# Patient Record
Sex: Male | Born: 1985 | Marital: Single
Health system: Southern US, Academic
[De-identification: ages and names within clinical notes are randomized; demographics above are authoritative.]

## PROBLEM LIST (undated history)

## (undated) DIAGNOSIS — K029 Dental caries, unspecified: Secondary | ICD-10-CM

## (undated) HISTORY — PX: LEG SURGERY: SHX1003

---
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
## 2010-11-06 ENCOUNTER — Emergency Department (EMERGENCY_DEPARTMENT_HOSPITAL)
Admission: EM | Admit: 2010-11-06 | Discharge: 2010-11-06 | Disposition: A | Discharge status: Home / Self Care | Payer: Self-pay

## 2010-11-06 ENCOUNTER — Emergency Department (EMERGENCY_DEPARTMENT_HOSPITAL): Payer: Self-pay

## 2018-10-16 ENCOUNTER — Other Ambulatory Visit: Payer: Self-pay

## 2018-10-16 ENCOUNTER — Ambulatory Visit (HOSPITAL_COMMUNITY)
Admission: EM | Admit: 2018-10-16 | Discharge: 2018-10-16 | Disposition: A | Discharge status: Home / Self Care | Payer: Self-pay | Attending: Family Medicine | Admitting: Family Medicine

## 2018-10-16 ENCOUNTER — Encounter (HOSPITAL_COMMUNITY): Payer: Self-pay | Admitting: Emergency Medicine

## 2018-10-16 DIAGNOSIS — R369 Urethral discharge, unspecified: Secondary | ICD-10-CM | POA: Insufficient documentation

## 2018-10-16 MED ORDER — AZITHROMYCIN 250 MG PO TABS
1000.0000 mg | ORAL_TABLET | Freq: Once | ORAL | Status: AC
  Administered 2018-10-16: 1000 mg via ORAL

## 2018-10-16 MED ORDER — CEFTRIAXONE SODIUM 250 MG IJ SOLR
INTRAMUSCULAR | Status: AC
  Filled 2018-10-16: qty 250

## 2018-10-16 MED ORDER — AZITHROMYCIN 250 MG PO TABS
ORAL_TABLET | ORAL | Status: AC
  Filled 2018-10-16: qty 4

## 2018-10-16 MED ORDER — CEFTRIAXONE SODIUM 250 MG IJ SOLR
250.0000 mg | Freq: Once | INTRAMUSCULAR | Status: AC
  Administered 2018-10-16: 250 mg via INTRAMUSCULAR

## 2018-10-16 NOTE — ED Triage Notes (Addendum)
Pt states he has been exposed to a possible STD.  He has been having penile discharge x3 days.  Pt is taking Orajel Mouthwash during triage for a filling he states came out.  He does not think it is infected, but he is in a lot of pain.

## 2018-10-16 NOTE — ED Provider Notes (Signed)
[facility]    CSN: [id number] Arrival date & time: 10/16/18  1353     History   Chief Complaint Chief Complaint  Patient presents with  . Exposure to STD    HPI Ronald Olson is a 33 y.o. male.   33 year old male comes in for 3 day history of penile discharge. Has also had dysuria without frequency, hematuria. Denies abdominal pain, nausea, vomiting. Denies fever, chills, night sweats. Denies testicular swelling/pain, penile lesion/sore. States for the past 2 months, has been sexually active with 2 male partners, no condom use.  Patient was using orajel mouthwash at triage. States filling came out. He has a dentist appointment for evaluation of these symptoms. He deferred evaluation to the mouth/teeth.     History reviewed. No pertinent past medical history.  There are no active problems to display for this patient.   History reviewed. No pertinent surgical history.     Home Medications    Prior to Admission medications   Not on File    Family History Family History  Family history unknown: Yes    Social History Social History   Tobacco Use  . Smoking status: Current Every Day Smoker    Packs/day: 1.00    Types: Cigarettes  . Smokeless tobacco: Never Used  Substance Use Topics  . Alcohol use: Never    Frequency: Never  . Drug use: Never     Allergies   Patient has no known allergies.   Review of Systems Review of Systems  Reason unable to perform ROS: See HPI as above.     Physical Exam Triage Vital Signs ED Triage Vitals  Enc Vitals Group     BP 10/16/18 1508 (!) 140/91     Pulse Rate 10/16/18 1508 97     Resp --      Temp 10/16/18 1508 98 F (36.7 C)     Temp Source 10/16/18 1508 Temporal     SpO2 10/16/18 1508 100 %     Weight --      Height --      Head Circumference --      Peak Flow --      Pain Score 10/16/18 1507 5     Pain Loc --      Pain Edu? --      Excl. in GC? --    No data found.  Updated  Vital Signs BP (!) 140/91 (BP Location: Right Arm)   Pulse 97   Temp 98 F (36.7 C) (Temporal)   SpO2 100%   Physical Exam Constitutional:      General: He is not in acute distress.    Appearance: He is well-developed. He is not diaphoretic.  HENT:     Head: Normocephalic and atraumatic.  Eyes:     Conjunctiva/sclera: Conjunctivae normal.     Pupils: Pupils are equal, round, and reactive to light.  Neurological:     Mental Status: He is alert and oriented to person, place, and time.      UC Treatments / Results  Labs (all labs ordered are listed, but only abnormal results are displayed) Labs Reviewed  URINE CYTOLOGY ANCILLARY ONLY    EKG None  Radiology No results found.  Procedures Procedures (including critical care time)  Medications Ordered in UC Medications  azithromycin (ZITHROMAX) tablet 1,000 mg (has no administration in time range)  cefTRIAXone (ROCEPHIN) injection 250 mg (has no administration in time range)    Initial Impression / Assessment  and Plan / UC Course  I have reviewed the triage vital signs and the nursing notes.  Pertinent labs & imaging results that were available during my care of the patient were reviewed by me and considered in my medical decision making (see chart for details).    Patient was treated empirically for GC. Azithromycin and Rocephin given in office today. Cytology sent, patient will be contacted with any positive results that require additional treatment. Patient to refrain from sexual activity for the next 7 days. Return precautions given.   Patient declined evaluation of dental pain. States have follow up with dentist. No further HPI/exam done.  Final Clinical Impressions(s) / UC Diagnoses   Final diagnoses:  Penile discharge    ED Prescriptions    None        Belinda FisherYu,  V, PA-C 10/16/18 1535

## 2018-10-16 NOTE — Discharge Instructions (Signed)
You were treated empirically for gonorrhea and chlamydia. Azithromycin 1g by mouth and Rocephin 250mg  injection given in office today. Cytology sent, you will be contacted with any positive results that requires further treatment. Refrain from sexual activity and alcohol use for the next 7 days. Follow up for further evaluation if having testicular pain/swelling, lesion/sore on the penis.

## 2018-10-17 LAB — URINE CYTOLOGY ANCILLARY ONLY
Chlamydia: POSITIVE — AB
Neisseria Gonorrhea: POSITIVE — AB
Trichomonas: NEGATIVE

## 2018-10-19 ENCOUNTER — Telehealth (HOSPITAL_COMMUNITY): Payer: Self-pay | Admitting: Emergency Medicine

## 2018-10-19 NOTE — Telephone Encounter (Signed)
Chlamydia is positive.  This was treated at the urgent care visit with po zithromax 1g.  Pt needs education to please refrain from sexual intercourse for 7 days to give the medicine time to work.  Sexual partners need to be notified and tested/treated.  Condoms may reduce risk of reinfection.  Recheck or followup with PCP for further evaluation if symptoms are not improving.  [facility] notified.  Test for gonorrhea was positive. This was treated at the urgent care visit with IM rocephin 250mg and po zithromax 1g. Pt needs education to refrain from sexual intercourse for 7 days after treatment to give the medicine time to work. Sexual partners need to be notified and tested/treated. Condoms may reduce risk of reinfection. Recheck or followup with PCP for further evaluation if symptoms are not improving. [facility] notified.   Attempted to reach patient. No answer at this time. Voicemail left.    

## 2018-10-23 ENCOUNTER — Telehealth (HOSPITAL_COMMUNITY): Payer: Self-pay | Admitting: Emergency Medicine

## 2018-10-23 NOTE — Telephone Encounter (Signed)
Patient contacted and made aware of all results, all questions answered. Pt states he has some "bumps on the skin down there". Pt encouraged to follow up here or with PCP for further evaluation. Pt agreeable to plan.

## 2018-10-23 NOTE — Telephone Encounter (Signed)
Attempted to reach patient x2. No answer at this time. Voicemail left.    

## 2018-12-07 ENCOUNTER — Telehealth (INDEPENDENT_AMBULATORY_CARE_PROVIDER_SITE_OTHER): Payer: Self-pay | Admitting: NURSE PRACTITIONER

## 2018-12-07 NOTE — Telephone Encounter (Signed)
Called regarding 12/14/18 appointment  Called and LMOM to call back to either confirm appointment, schedule televisit or re-schedule for June

## 2020-11-21 ENCOUNTER — Emergency Department (HOSPITAL_COMMUNITY)
Admission: EM | Admit: 2020-11-21 | Discharge: 2020-11-21 | Disposition: A | Discharge status: Home / Self Care | Payer: Self-pay | Attending: Emergency Medicine | Admitting: Emergency Medicine

## 2020-11-21 ENCOUNTER — Other Ambulatory Visit: Payer: Self-pay

## 2020-11-21 ENCOUNTER — Encounter (HOSPITAL_COMMUNITY): Payer: Self-pay | Admitting: *Deleted

## 2020-11-21 DIAGNOSIS — K0889 Other specified disorders of teeth and supporting structures: Secondary | ICD-10-CM | POA: Insufficient documentation

## 2020-11-21 DIAGNOSIS — K029 Dental caries, unspecified: Secondary | ICD-10-CM | POA: Insufficient documentation

## 2020-11-21 DIAGNOSIS — Z5321 Procedure and treatment not carried out due to patient leaving prior to being seen by health care provider: Secondary | ICD-10-CM | POA: Insufficient documentation

## 2020-11-21 HISTORY — DX: Dental caries, unspecified: K02.9

## 2020-11-21 NOTE — ED Notes (Signed)
Called pt again, no response.

## 2020-11-21 NOTE — ED Notes (Addendum)
Called pt 3x for vitals, no response.  

## 2020-11-21 NOTE — ED Triage Notes (Signed)
Pt is here for dental pain.  Pt has decaying teeth on left lower jaw.

## 2021-03-05 ENCOUNTER — Ambulatory Visit (INDEPENDENT_AMBULATORY_CARE_PROVIDER_SITE_OTHER): Payer: No Payment, Other | Admitting: Licensed Clinical Social Worker

## 2021-03-05 ENCOUNTER — Encounter (HOSPITAL_COMMUNITY): Payer: Self-pay | Admitting: Licensed Clinical Social Worker

## 2021-03-05 DIAGNOSIS — Z008 Encounter for other general examination: Secondary | ICD-10-CM

## 2021-03-05 DIAGNOSIS — Z046 Encounter for general psychiatric examination, requested by authority: Secondary | ICD-10-CM

## 2021-03-05 NOTE — Progress Notes (Signed)
Comprehensive Clinical Assessment (CCA) Note  03/05/2021 Ronald Olson [id number]  Chief Complaint:  Chief Complaint  Patient presents with   Mental Health Assessment     Need assessment for Task    Visit Diagnosis: Psych Assessment.   Virtual Visit via Video Note  I connected with Ronald Olson on 03/05/21 at  2:00 PM EDT by a video enabled telemedicine application and verified that I am speaking with the correct person using two identifiers.  Location: Patient: [facility]  Provider: [facility]    I discussed the limitations of evaluation and management by telemedicine and the availability of in person appointments. The patient expressed understanding and agreed to proceed.   Client is a 35 year old male. Client is referred by Task for a Psych assessment.   Client denies suicidal and homicidal ideations currently  Client denies hallucinations and delusions currently  Client was screened for the following SDOH: smoking, financials, and social interaction   Assessment Information that integrates subjective and objective details with a therapist's professional interpretation:     Pt was alert and oriented x 5. He was dressed casually and engaged well in therapy session. Pt presented with euthymic mood/affect. He was cooperative and maintained good eye contact.   Pt comes in today as a referral from Task. Pt was arrested for drug possession. He reports he was giving a friend a ride home. She left drug in his car and the police pulled him over. They gave him 58-month probation which ends this August. Psych Assessment is the last criteria he needs to complete his probation.   Pt reports that he has good external relationships with friends and family. He lives with his fianc currently and reports no problems currently. He states that his daughter who is 5 years old will come with them when she turns 65 so she can go to college in [place]. 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 He denied AVH, SI/HI, and substance abuse.   Client meets criteria for: Psych assessment    Client states use of the following substances: None reported     Treatment recommendations are included plan: F/u with probation officer and complete Probation by August     Client agreed with treatment recommendations.      I discussed the assessment and treatment plan with the patient. The patient was provided an opportunity to ask questions and all were answered. The patient agreed with the plan and demonstrated an understanding of the instructions.   The patient was advised to call back or seek an in-person evaluation if the symptoms worsen or if the condition fails to improve as anticipated.  I provided 45  minutes of non-face-to-face time during this encounter.   Weber Cooks, LCSW    CCA Screening, Triage and Referral (STR)  Patient Reported Information How did you hear about [place]? Other (Comment)  Referral name: TASK   Whom do you see for routine medical problems? I don't have a doctor    Have You Recently Been in Any Inpatient Treatment (Hospital/Detox/Crisis Center/28-Day Program)? No   Have You Ever Received Services From [employer] Before? No   Have You Recently Had Any Thoughts About Hurting Yourself? No  Are You Planning to Commit Suicide/Harm Yourself At This time? No   Have you Recently Had Thoughts About Hurting Someone Karolee Ohs? No   Have You Used Any Alcohol or Drugs in the Past 24 Hours? No   Do You Currently  Have a Therapist/Psychiatrist? No   Have You Been Recently Discharged From Any Office Practice or Programs? No     CCA Screening Triage Referral Assessment Type of Contact: Tele-Assessment  Is this Initial or Reassessment? Initial Assessment  Date Telepsych consult ordered in [facility]:  03/05/21  Is CPS involved or ever been involved? Never  Is APS involved or ever been involved?  Never  Patient Determined To Be At Risk for Harm To Self or Others Based on Review of Patient Reported Information or Presenting Complaint? No  Location of Assessment: [facility]  [place] of Residence: [place]   CCA Biopsychosocial Intake/Chief Complaint:  TASK: Mental Health Assessment.  Strengths: , good dad, good sig other,  Abilities: artstic  Type of Services Patient Feels are Needed: None at this time  Initial Clinical Notes/Concerns: assessment needed for TASK   Mental Health Symptoms Depression:   Difficulty Concentrating; None   Duration of Depressive symptoms: No data recorded  Mania:   None   Anxiety:    None   Psychosis:   None   Duration of Psychotic symptoms: No data recorded  Trauma:   None   Obsessions:   None   Compulsions:   None   Inattention:   None   Hyperactivity/Impulsivity:   None   Oppositional/Defiant Behaviors:   None   Emotional Irregularity:   None   Other Mood/Personality Symptoms:  No data recorded   Mental Status Exam Appearance and self-care  Stature:   Average   Weight:   Average weight   Clothing:   Casual   Grooming:   Normal   Cosmetic use:   None   Posture/gait:   Normal   Motor activity:   Not Remarkable   Sensorium  Attention:   Normal   Concentration:   Normal   Orientation:   X5   Recall/memory:   Normal   Affect and Mood  Affect:  No data recorded  Mood:  No data recorded  Relating  Eye contact:   Normal   Facial expression:  No data recorded  Attitude toward examiner:   Cooperative   Thought and Language  Speech flow:  Clear and Coherent   Thought content:   Appropriate to Mood and Circumstances   Preoccupation:  No data recorded  Hallucinations:  No data recorded  Organization:  No data recorded  [employer] of Knowledge:   Fair   Intelligence:   Average   Abstraction:   Functional   Judgement:   Fair   Education administrator:   Realistic   Insight:   Fair   Decision Making:   Normal   Social Functioning  Social Maturity:  No data recorded  Social Judgement:   Normal   Stress  Stressors:   Legal   Coping Ability:  No data recorded  Skill Deficits:  No data recorded  Supports:   Family; Friends/Service system     Religion: Religion/Spirituality Are You A Religious Person?: No  Leisure/Recreation: Leisure / Recreation Do You Have Hobbies?: Yes Leisure and Hobbies: art tatoos  Exercise/Diet: Exercise/Diet Do You Exercise?: Yes What Type of Exercise Do You Do?: Run/Walk How Many Times a Week Do You Exercise?: 6-7 times a week Have You Gained or Lost A Significant Amount of Weight in the Past Six Months?: No Do You Follow a Special Diet?: No Do You Have Any Trouble Sleeping?: No   CCA Employment/Education Employment/Work Situation: Employment / Work Situation Employment Situation: Employed Where  is Patient Currently Employed?: [employer] How Long has Patient Been Employed?: Part owner opened 9 months Are You Satisfied With Your Job?: Yes Do You Work More Than One Job?: No Patient's Job has Been Impacted by Current Illness: No Has Patient ever Been in the [employer]?: No  Education: Education Is Patient Currently Attending School?: No Last Grade Completed: 11 Did Garment/textile technologist From [employer]?: Yes (GED) Did You Attend College?: No Did You Attend Graduate School?: No Did You Have An Individualized Education Program (IIEP): No Did You Have Any Difficulty At School?: No Patient's Education Has Been Impacted by Current Illness: No   CCA Family/Childhood History Family and Relationship History: Family history Marital status: Long term relationship Long term relationship, how long?: 2.5 years Are you sexually active?: Yes What is your sexual orientation?: hetrosexual Has your sexual activity been affected by drugs, alcohol, medication, or emotional stress?: none  reported Does patient have children?: Yes How many children?: 1 How is patient's relationship with their children?: 43 years old daughter lives in [place].  Childhood History:  Childhood History By whom was/is the patient raised?: Foster parents Additional childhood history information: Grew up in foster care system. Grandmother adopted him when pt was 14 years Description of patient's relationship with caregiver when they were a child: great with grandmother Patient's description of current relationship with people who raised him/her: Passed away in 2011/12/29 Does patient have siblings?: Yes Number of Siblings: 0 Did patient suffer any verbal/emotional/physical/sexual abuse as a child?: No Did patient suffer from severe childhood neglect?: No Has patient ever been sexually abused/assaulted/raped as an adolescent or adult?: No Was the patient ever a victim of a crime or a disaster?: No Witnessed domestic violence?: No Has patient been affected by domestic violence as an adult?: No  Child/Adolescent Assessment:     CCA Substance Use Alcohol/Drug Use: Alcohol / Drug Use History of alcohol / drug use?: No history of alcohol / drug abuse    DSM5 Diagnoses: Patient Active Problem List   Diagnosis Date Noted   Psychological assessment 03/05/2021      Weber Cooks, LCSW

## 2022-01-17 ENCOUNTER — Emergency Department (HOSPITAL_COMMUNITY)
Admission: EM | Admit: 2022-01-17 | Discharge: 2022-01-17 | Disposition: A | Discharge status: Home / Self Care | Payer: Self-pay | Attending: Emergency Medicine | Admitting: Emergency Medicine

## 2022-01-17 ENCOUNTER — Emergency Department (HOSPITAL_COMMUNITY): Payer: Self-pay

## 2022-01-17 ENCOUNTER — Other Ambulatory Visit: Payer: Self-pay

## 2022-01-17 ENCOUNTER — Encounter (HOSPITAL_COMMUNITY): Payer: Self-pay | Admitting: Emergency Medicine

## 2022-01-17 DIAGNOSIS — Y990 Civilian activity done for income or pay: Secondary | ICD-10-CM | POA: Insufficient documentation

## 2022-01-17 DIAGNOSIS — S9031XA Contusion of right foot, initial encounter: Secondary | ICD-10-CM | POA: Insufficient documentation

## 2022-01-17 DIAGNOSIS — W231XXA Caught, crushed, jammed, or pinched between stationary objects, initial encounter: Secondary | ICD-10-CM | POA: Insufficient documentation

## 2022-01-17 NOTE — Discharge Instructions (Signed)
Your x-ray did not show any fractures. ? ?Please elevate your foot to Tylenol and ibuprofen as discussed below ? ?Continue to follow-up with your primary care provider as you would normally ? ? ? ? ? ?Please use Tylenol or ibuprofen for pain.  You may use 600 mg ibuprofen every 6 hours or 1000 mg of Tylenol every 6 hours.  You may choose to alternate between the 2.  This would be most effective.  Not to exceed 4 g of Tylenol within 24 hours.  Not to exceed 3200 mg ibuprofen 24 hours.  ?

## 2022-01-17 NOTE — ED Triage Notes (Signed)
Pt states he was working on his Jeep last night and the jack slipped and the jeep landed on his R foot.  C/o R foot pain. ?

## 2022-01-17 NOTE — ED Provider Notes (Signed)
?[facility] EMERGENCY DEPARTMENT ?Provider Note ? ? ?CSN: [id number] ?Arrival date & time: 01/17/22  1043 ? ?  ? ?History ? ?Chief Complaint  ?Patient presents with  ? Foot Pain  ? ? ?Benjamim Harnish is a 36 y.o. male. ? ? ?Foot Pain ?Patient is a 36 year old male presented emergency room today with pain in his right foot seems to be along the distal digit of his right toes. ? ?He states that yesterday evening a jeep that he was working on slipped off the jack and part of the car crushed his toes.  He states the full weight of the car was on his toes. ? ?He states he has had achy pain since that time.  Is been moderate but not severe.  He has taken no medications this morning and states that his pain is primarily when he pushes on his toes primarily his second third and fourth toe ? ?He states he has sensation but states that it is difficult to move his toes and he feels that his toes are swollen. ? ?  ? ?Home Medications ?Prior to Admission medications   ?Not on File  ?   ? ?Allergies    ?Patient has no known allergies.   ? ?Review of Systems   ?Review of Systems ? ?Physical Exam ?Updated Vital Signs ?BP (!) 143/88   Pulse 93   Temp 98 ?F (36.7 ?C)   Resp 20   SpO2 100%  ?Physical Exam ?Vitals and nursing note reviewed.  ?Constitutional:   ?   General: He is not in acute distress. ?   Appearance: Normal appearance. He is not ill-appearing.  ?HENT:  ?   Head: Normocephalic and atraumatic.  ?Eyes:  ?   General: No scleral icterus.    ?   Right eye: No discharge.     ?   Left eye: No discharge.  ?   Conjunctiva/sclera: Conjunctivae normal.  ?Pulmonary:  ?   Effort: Pulmonary effort is normal.  ?   Breath sounds: No stridor.  ?Musculoskeletal:  ?   Comments: No subungual hematoma.  There is tenderness at the distal joint of the second third and fourth toe on the right foot.  No tenderness proximal to this.  Able to wiggle great toe without difficulty.  Cap refill less than 2 seconds   ?Neurological:  ?   Mental Status: He is alert and oriented to person, place, and time. Mental status is at baseline.  ? ? ?ED Results / Procedures / Treatments   ?Labs ?(all labs ordered are listed, but only abnormal results are displayed) ?Labs Reviewed - No data to display ? ?EKG ?None ? ?Radiology ?DG Foot Complete Right ? ?Result Date: 01/17/2022 ?CLINICAL DATA:  Right foot pain, jack fell on it EXAM: RIGHT FOOT COMPLETE - 3+ VIEW COMPARISON:  None Available. FINDINGS: There is no evidence of fracture or dislocation. There is no evidence of arthropathy or other focal bone abnormality. Partially imaged plate and screw fixation of the distal fibula. Soft tissues are unremarkable. IMPRESSION: No fracture or dislocation of the right foot. Partially imaged plate and screw fixation of the distal fibula. Electronically Signed   By: Jearld Lesch M.D.   On: 01/17/2022 11:36   ? ?Procedures ?Procedures  ? ? ?Medications Ordered in ED ?Medications - No data to display ? ?ED Course/ Medical Decision Making/ A&P ?  ?                        ?  Medical Decision Making ?Amount and/or Complexity of Data Reviewed ?Radiology: ordered. ? ? ?Patient has a crush injury of his right foot over the second third and fourth toes on the right side.  He is distally neurovascularly intact does seem to have some discomfort with movement of his toes but is able to move them.  Does have sensation although it feels somewhat decreased.  Cap refill is intact ? ?I personally reviewed x-ray of foot I do not appreciate any fractures I agree with radiology read no acute fractures. ? ?Patient given postop shoe recommend Tylenol ibuprofen.  I offered analgesia here in the ER which she declined. ? ? ?Final Clinical Impression(s) / ED Diagnoses ?Final diagnoses:  ?Contusion of right foot, initial encounter  ? ? ?Rx / DC Orders ?ED Discharge Orders   ? ? None  ? ?  ? ? ?  ?Gailen Shelter, [place] ?01/17/22 1403 ? ?  ?Gerhard Munch, MD ?01/17/22 1523 ? ?

## 2022-02-12 ENCOUNTER — Emergency Department (HOSPITAL_COMMUNITY)
Admission: EM | Admit: 2022-02-12 | Discharge: 2022-02-13 | Disposition: A | Discharge status: Home / Self Care | Payer: Self-pay | Attending: Emergency Medicine | Admitting: Emergency Medicine

## 2022-02-12 ENCOUNTER — Other Ambulatory Visit: Payer: Self-pay

## 2022-02-12 ENCOUNTER — Encounter (HOSPITAL_COMMUNITY): Payer: Self-pay

## 2022-02-12 DIAGNOSIS — L0201 Cutaneous abscess of face: Secondary | ICD-10-CM | POA: Insufficient documentation

## 2022-02-12 LAB — URINALYSIS, ROUTINE W REFLEX MICROSCOPIC
Bilirubin Urine: NEGATIVE
Glucose, UA: NEGATIVE mg/dL
Hgb urine dipstick: NEGATIVE
Ketones, ur: NEGATIVE mg/dL
Leukocytes,Ua: NEGATIVE
Nitrite: NEGATIVE
Protein, ur: NEGATIVE mg/dL
Specific Gravity, Urine: 1.002 — ABNORMAL LOW (ref 1.005–1.030)
pH: 7 (ref 5.0–8.0)

## 2022-02-12 LAB — CBC WITH DIFFERENTIAL/PLATELET
Abs Immature Granulocytes: 0.02 10*3/uL (ref 0.00–0.07)
Basophils Absolute: 0.1 10*3/uL (ref 0.0–0.1)
Basophils Relative: 1 %
Eosinophils Absolute: 0.2 10*3/uL (ref 0.0–0.5)
Eosinophils Relative: 2 %
HCT: 46.5 % (ref 39.0–52.0)
Hemoglobin: 15.3 g/dL (ref 13.0–17.0)
Immature Granulocytes: 0 %
Lymphocytes Relative: 21 %
Lymphs Abs: 2.2 10*3/uL (ref 0.7–4.0)
MCH: 29.3 pg (ref 26.0–34.0)
MCHC: 32.9 g/dL (ref 30.0–36.0)
MCV: 89.1 fL (ref 80.0–100.0)
Monocytes Absolute: 0.7 10*3/uL (ref 0.1–1.0)
Monocytes Relative: 7 %
Neutro Abs: 7.3 10*3/uL (ref 1.7–7.7)
Neutrophils Relative %: 69 %
Platelets: 307 10*3/uL (ref 150–400)
RBC: 5.22 MIL/uL (ref 4.22–5.81)
RDW: 13.6 % (ref 11.5–15.5)
WBC: 10.4 10*3/uL (ref 4.0–10.5)
nRBC: 0 % (ref 0.0–0.2)

## 2022-02-12 LAB — COMPREHENSIVE METABOLIC PANEL
ALT: 84 U/L — ABNORMAL HIGH (ref 0–44)
AST: 54 U/L — ABNORMAL HIGH (ref 15–41)
Albumin: 4 g/dL (ref 3.5–5.0)
Alkaline Phosphatase: 82 U/L (ref 38–126)
Anion gap: 6 (ref 5–15)
BUN: 9 mg/dL (ref 6–20)
CO2: 25 mmol/L (ref 22–32)
Calcium: 9.2 mg/dL (ref 8.9–10.3)
Chloride: 107 mmol/L (ref 98–111)
Creatinine, Ser: 0.79 mg/dL (ref 0.61–1.24)
GFR, Estimated: >60 mL/min (ref >60)
Glucose, Bld: 104 mg/dL — ABNORMAL HIGH (ref 70–99)
Potassium: 3.9 mmol/L (ref 3.5–5.1)
Sodium: 138 mmol/L (ref 135–145)
Total Bilirubin: 0.8 mg/dL (ref 0.3–1.2)
Total Protein: 7.4 g/dL (ref 6.5–8.1)

## 2022-02-12 MED ORDER — AMOXICILLIN-POT CLAVULANATE 875-125 MG PO TABS: 1.0000 | ORAL_TABLET | Freq: Two times a day (BID) | ORAL | 0 refills | Status: AC

## 2022-02-12 MED ORDER — AMOXICILLIN-POT CLAVULANATE 875-125 MG PO TABS
1.0000 | ORAL_TABLET | Freq: Once | ORAL | Status: AC
  Administered 2022-02-13: 1 via ORAL
  Filled 2022-02-12: qty 1

## 2022-02-12 MED ORDER — HYDROCODONE-ACETAMINOPHEN 5-325 MG PO TABS
1.0000 | ORAL_TABLET | Freq: Once | ORAL | Status: AC
  Administered 2022-02-13: 1 via ORAL
  Filled 2022-02-12: qty 1

## 2022-02-12 NOTE — ED Triage Notes (Signed)
Pt states he woke up with an abscess on the right cheek. Pt's right cheek has 3+ swelling. Pt believes a spider bit him. Pt states he has black windows and brown recluse spiders all over his house. Pt has a bump the approx the size of a nickle on his right cheek and it's erythematous. Pt states he does have difficulty swallowing. Pt able to maintain secretions.

## 2022-02-12 NOTE — ED Provider Notes (Signed)
[facility] Provider Note  CSN: [id number] Arrival date & time: 02/12/22 1749  Chief Complaint(s) Abscess  HPI Ronald Olson is a 36 y.o. male     Abscess Location:  Face Facial abscess location:  R cheek Size:  2cm Abscess quality: induration, painful, redness and warmth   Duration:  1 day Progression:  Worsening Pain details:    Quality:  Throbbing   Severity:  Moderate   Timing:  Constant Chronicity:  New Relieved by:  Nothing Worsened by:  Draining/squeezing Associated symptoms: no fever   Risk factors: no family hx of MRSA, no hx of MRSA and no prior abscess     Past Medical History Past Medical History:  Diagnosis Date   Dental caries    Patient Active Problem List   Diagnosis Date Noted   Psychological assessment 03/05/2021   Home Medication(s) Prior to Admission medications   Medication Sig Start Date End Date Taking? Authorizing Provider  amoxicillin-clavulanate (AUGMENTIN) 875-125 MG tablet Take 1 tablet by mouth every 12 (twelve) hours. 02/12/22  Yes Leonardo Plaia, Grayce Sessions, MD                                                                                                                                    Allergies Patient has no known allergies.  Review of Systems Review of Systems  Constitutional:  Negative for fever.   As noted in HPI  Physical Exam Vital Signs  I have reviewed the triage vital signs BP 136/78   Pulse 88   Temp 98.3 F (36.8 C) (Oral)   Resp 15   Ht 6' (1.829 m)   Wt 79.4 kg   SpO2 99%   BMI 23.74 kg/m   Physical Exam Vitals reviewed.  Constitutional:      General: He is not in acute distress.    Appearance: He is well-developed. He is not diaphoretic.  HENT:     Head: Normocephalic and atraumatic.      Right Ear: External ear normal.     Left Ear: External ear normal.     Nose: Nose normal.     Mouth/Throat:     Mouth: Mucous membranes are moist.  Eyes:      General: No scleral icterus.    Conjunctiva/sclera: Conjunctivae normal.  Neck:     Trachea: Phonation normal.  Cardiovascular:     Rate and Rhythm: Normal rate and regular rhythm.  Pulmonary:     Effort: Pulmonary effort is normal. No respiratory distress.     Breath sounds: No stridor.  Abdominal:     General: There is no distension.  Musculoskeletal:        General: Normal range of motion.     Cervical back: Normal range of motion.  Skin:    Comments: Tattoos throughout the body  Neurological:     Mental Status: He is alert and oriented to person, place, and  time.  Psychiatric:        Behavior: Behavior normal.     ED Results and Treatments Labs (all labs ordered are listed, but only abnormal results are displayed) Labs Reviewed  COMPREHENSIVE METABOLIC PANEL - Abnormal; Notable for the following components:      Result Value   Glucose, Bld 104 (*)    AST 54 (*)    ALT 84 (*)    All other components within normal limits  URINALYSIS, ROUTINE W REFLEX MICROSCOPIC - Abnormal; Notable for the following components:   APPearance HAZY (*)    Specific Gravity, Urine 1.002 (*)    All other components within normal limits  CBC WITH DIFFERENTIAL/PLATELET  LACTIC ACID, PLASMA  LACTIC ACID, PLASMA                                                                                                                         EKG  EKG Interpretation  Date/Time:    Ventricular Rate:    PR Interval:    QRS Duration:   QT Interval:    QTC Calculation:   R Axis:     Text Interpretation:         Radiology No results found.  Pertinent labs & imaging results that were available during my care of the patient were reviewed by me and considered in my medical decision making (see MDM for details).  Medications Ordered in ED Medications  HYDROcodone-acetaminophen (NORCO/VICODIN) 5-325 MG per tablet 1 tablet (has no administration in time range)  amoxicillin-clavulanate (AUGMENTIN)  875-125 MG per tablet 1 tablet (has no administration in time range)                                                                                                                                     Procedures Procedures  (including critical care time)  Medical Decision Making / ED Course    Complexity of Problem:   Patient's presenting problem/concern, DDX, and MDM listed below: Right cheek abscess More consistent with an ingrown hair.   Complexity of Data:    Laboratory Tests ordered listed below with my independent interpretation: CBC without leukocytosis or anemia No significant electrolyte derangements or renal sufficiency       ED Course:    Assessment, Add'l Intervention, and Reassessment: Right cheek abscess secondary to ingrown hair. Will treat with Abx.    Final Clinical Impression(s) /  ED Diagnoses Final diagnoses:  Abscess of face   The patient appears reasonably screened and/or stabilized for discharge and I doubt any other medical condition or other [facility] requiring further screening, evaluation, or treatment in the ED at this time prior to discharge. Safe for discharge with strict return precautions.  Disposition: Discharge  Condition: Good  I have discussed the results, Dx and Tx plan with the patient/family who expressed understanding and agree(s) with the plan. Discharge instructions discussed at length. The patient/family was given strict return precautions who verbalized understanding of the instructions. No further questions at time of discharge.    ED Discharge Orders          Ordered    amoxicillin-clavulanate (AUGMENTIN) 875-125 MG tablet  Every 12 hours        02/12/22 2346            Follow Up: Primary care provider  Call  to schedule an appointment for close follow up           This chart was dictated using voice recognition software.  Despite best efforts to proofread,  errors can occur which can change the  documentation meaning.    Fatima Blank, MD 02/12/22 [phone]

## 2023-06-18 IMAGING — DX DG FOOT COMPLETE 3+V*R*
3 series · 3 of 3 positions shown · non-contrast
Comparison: None Available.

CLINICAL DATA: Right foot pain, [place] fell on it

EXAM:
RIGHT FOOT COMPLETE - 3+ VIEW

[foot ap]
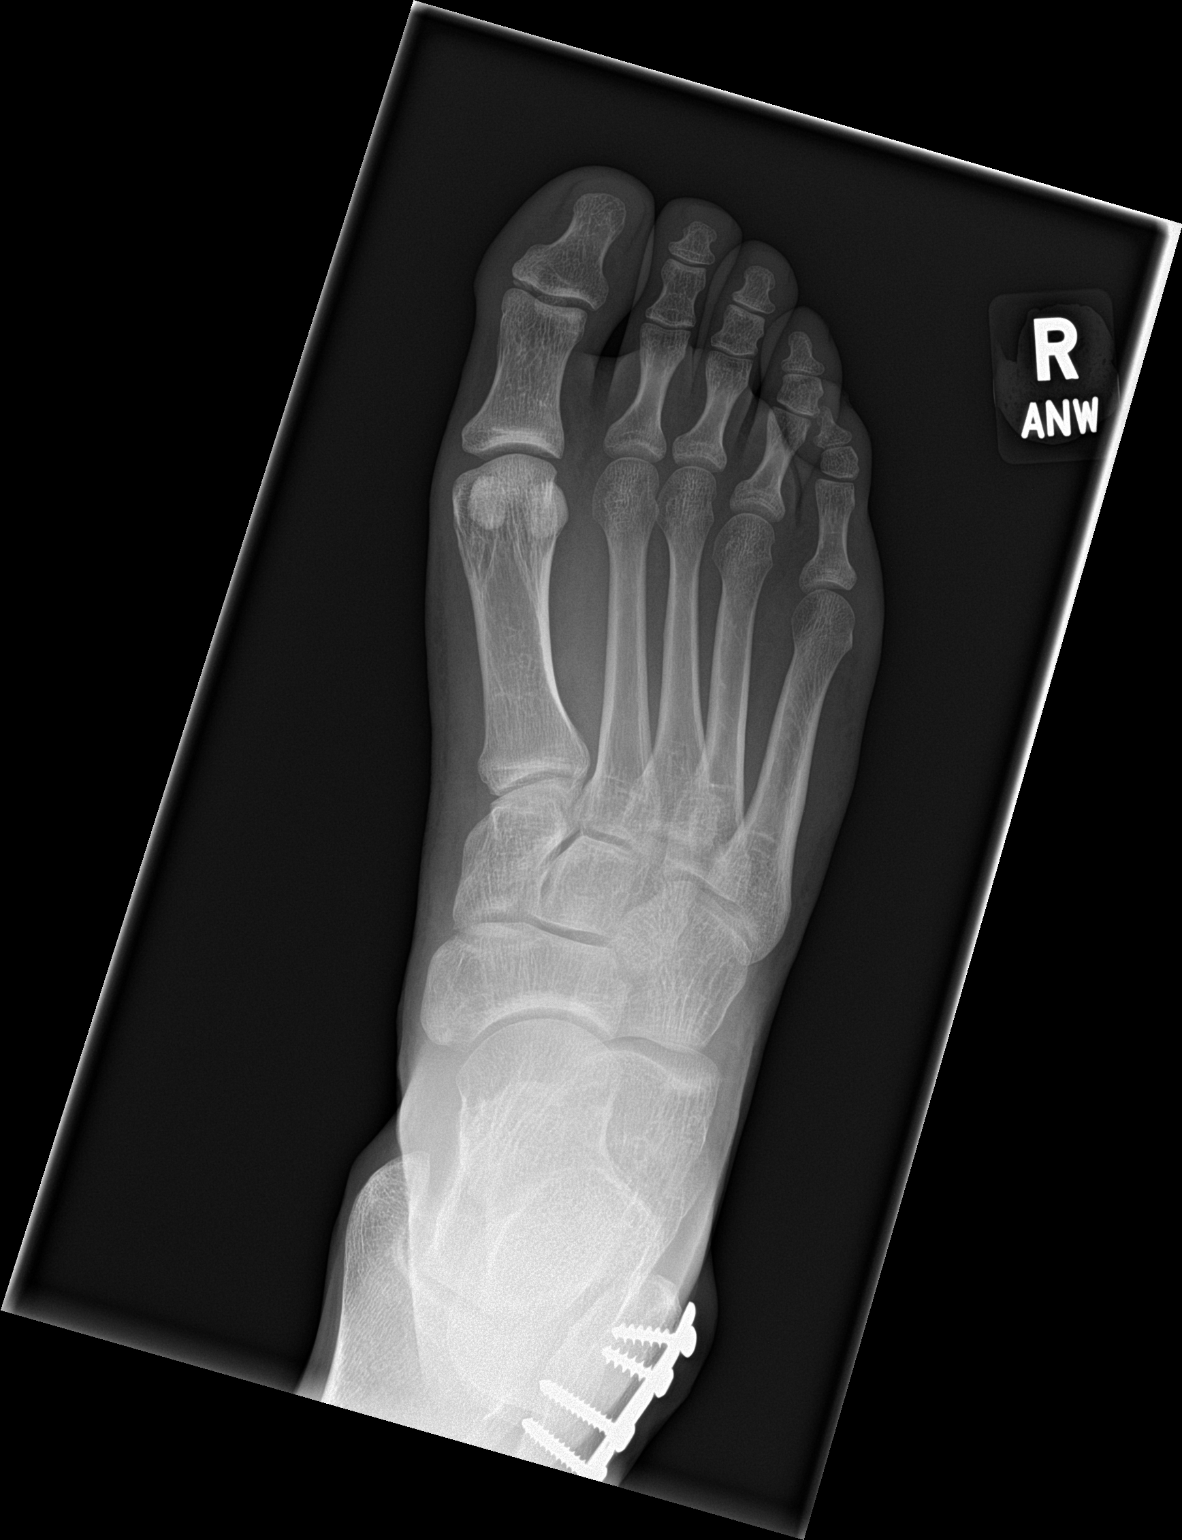

[foot obl]
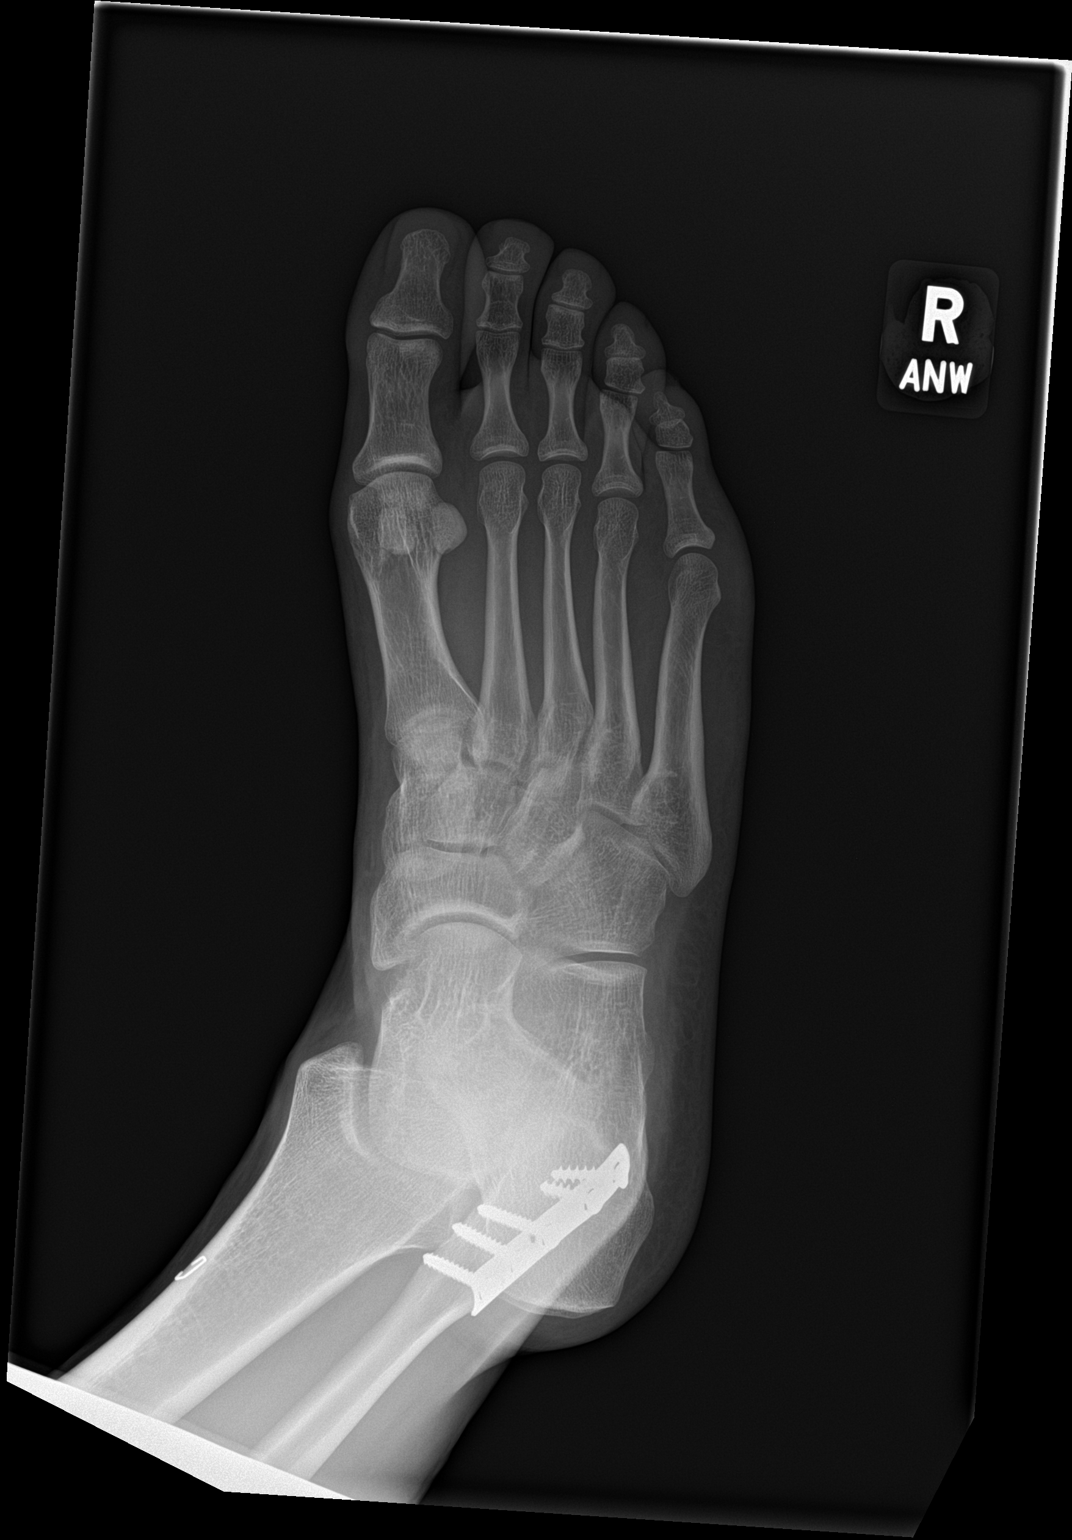

[foot lat]
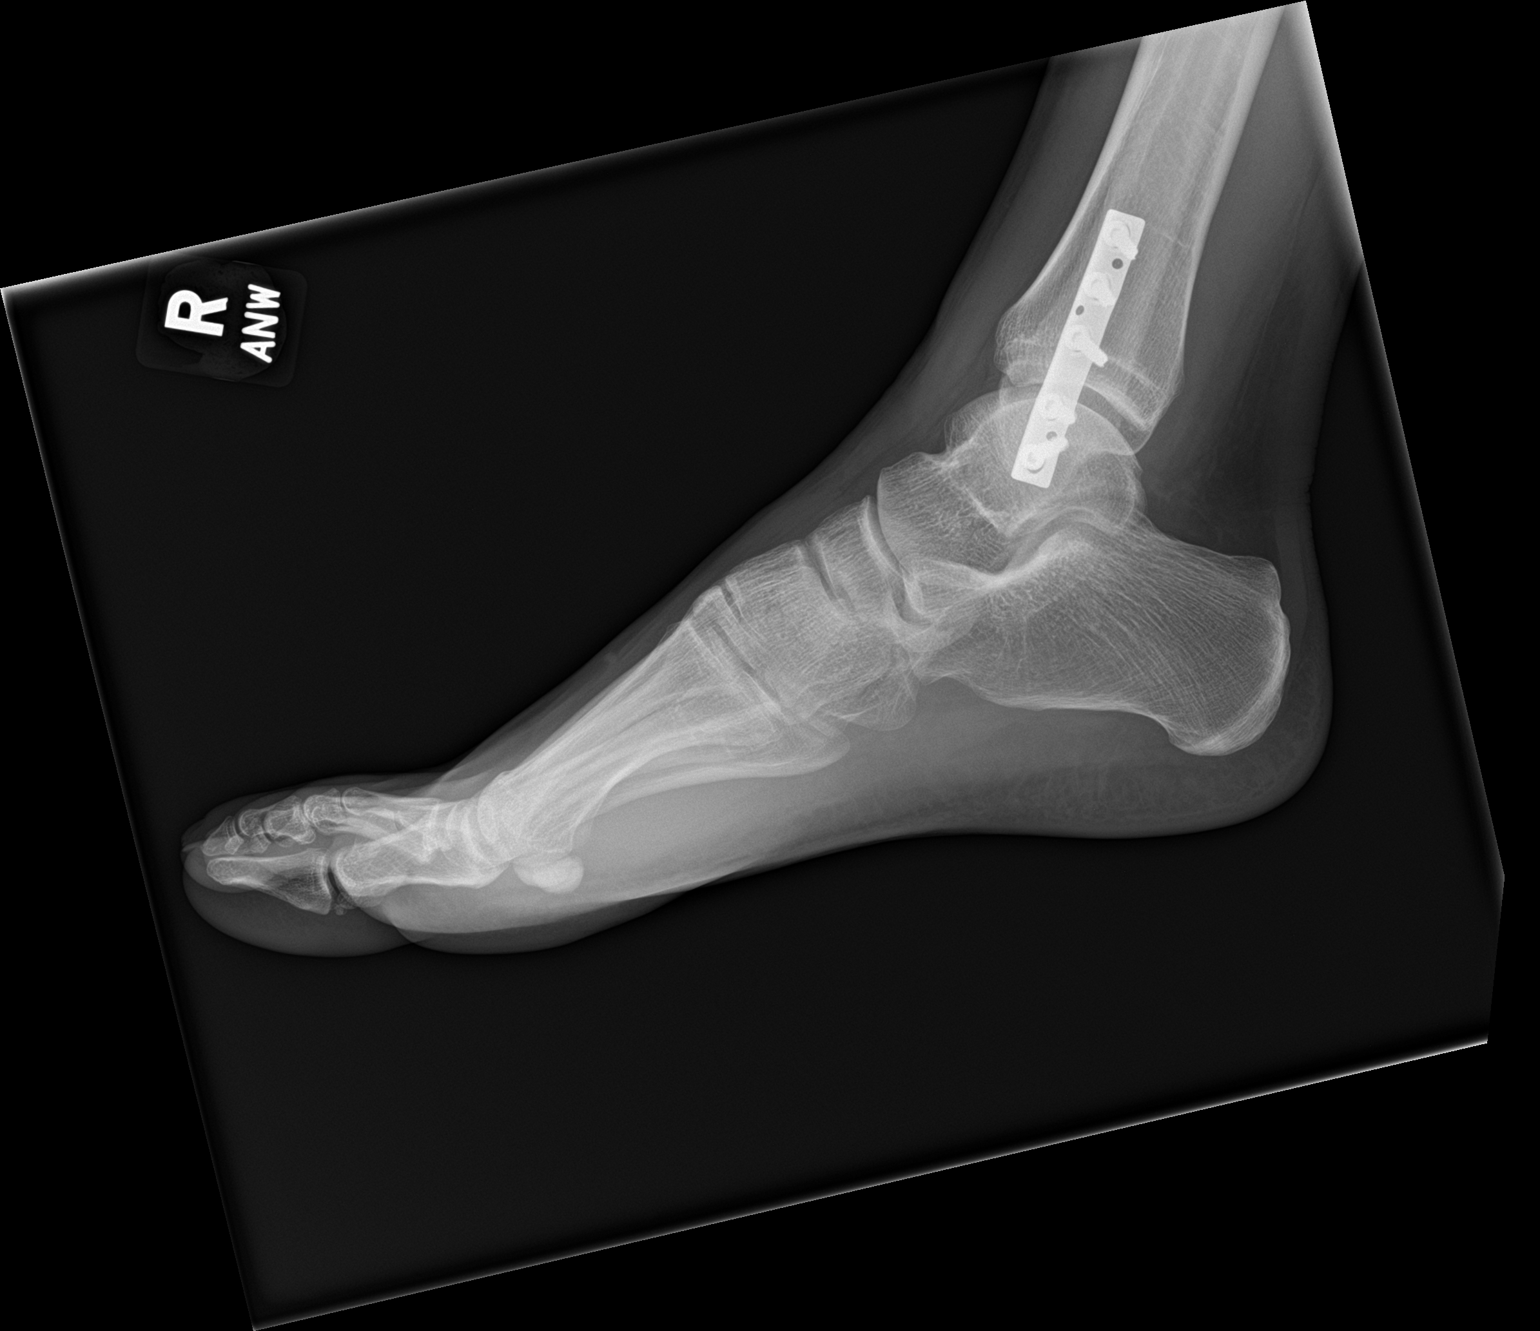

[3 of 3 positions shown; findings below may reference images not displayed]

FINDINGS: There is no evidence of fracture or dislocation. There is no
evidence of arthropathy or other focal bone abnormality. Partially
imaged plate and screw fixation of the distal fibula. Soft tissues
are unremarkable.
IMPRESSION: No fracture or dislocation of the right foot. Partially imaged plate
and screw fixation of the distal fibula.
